# Patient Record
Sex: Male | Born: 1980 | Race: White | Hispanic: No | Marital: Married
Health system: Southern US, Community
[De-identification: ages and names within clinical notes are randomized; demographics above are authoritative.]

---

## 2020-01-03 ENCOUNTER — Other Ambulatory Visit: Payer: Self-pay | Admitting: Cardiovascular Disease

## 2020-01-03 ENCOUNTER — Other Ambulatory Visit (HOSPITAL_COMMUNITY): Payer: Self-pay | Admitting: Cardiovascular Disease

## 2020-01-03 DIAGNOSIS — R072 Precordial pain: Secondary | ICD-10-CM

## 2020-01-06 ENCOUNTER — Telehealth (HOSPITAL_COMMUNITY): Payer: Self-pay | Admitting: Emergency Medicine

## 2020-01-06 NOTE — Telephone Encounter (Signed)
Reaching out to patient to offer assistance regarding upcoming cardiac imaging study; pt verbalizes understanding of appt date/time, parking situation and where to check in, pre-test NPO status and medications ordered, and verified current allergies; name and call back number provided for further questions should they arise Rockwell Alexandria RN Navigator Cardiac Imaging [facility] [phone] office [phone] cell   Pt takes 25mg  metoprolol succinate daily at night. renaldo instructed to take additional the morning of test for HR control.  

## 2020-01-07 ENCOUNTER — Other Ambulatory Visit: Payer: Self-pay

## 2020-01-07 ENCOUNTER — Ambulatory Visit (HOSPITAL_COMMUNITY)
Admission: RE | Admit: 2020-01-07 | Discharge: 2020-01-07 | Disposition: A | Discharge status: Home / Self Care | Payer: BC Managed Care – PPO | Source: Ambulatory Visit | Attending: Cardiovascular Disease | Admitting: Cardiovascular Disease

## 2020-01-07 DIAGNOSIS — R072 Precordial pain: Secondary | ICD-10-CM | POA: Insufficient documentation

## 2020-01-07 IMAGING — CT CT HEART MORP W/ CTA COR W/ SCORE W/ CA W/CM &/OR W/O CM
4 of 7 series · 8 of 20 positions shown, 9 images · IV contrast (APPLIED)
Comparison: None.
COMPARISON: None.

Addendum:
EXAM:
OVER-READ INTERPRETATION  CT CHEST

The following report is an over-read performed by radiologist Dr.
over-read does not include interpretation of cardiac or coronary
anatomy or pathology. The coronary CTA interpretation by the
cardiologist is attached.
CLINICAL DATA: 39-year-old male with chest pain.
Cardiac/Coronary  CTA
TECHNIQUE: The patient was scanned on a [employer] scanner.

[Series 6: best diast 73 % · axial · 0.39mm/px · z∈[+13,+50]mm · 2 of 283 slices shown, 3 images]
[im 95/283  vessel]
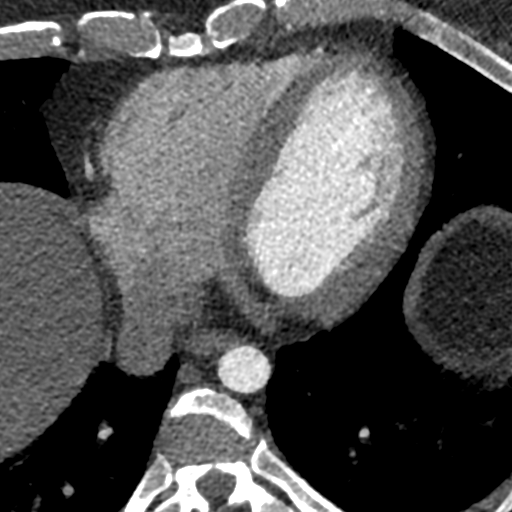
[im 95/283  lung]
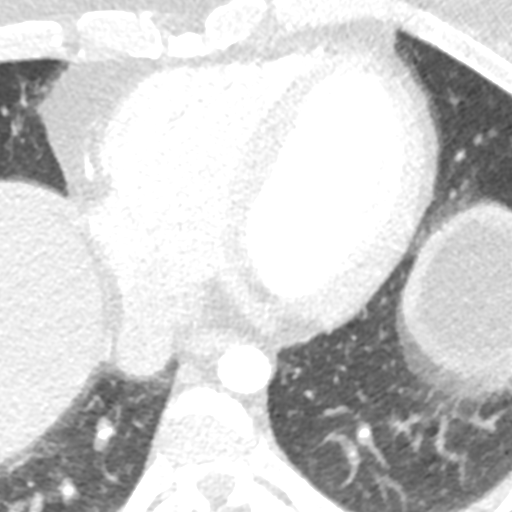
[im 189/283  vessel]
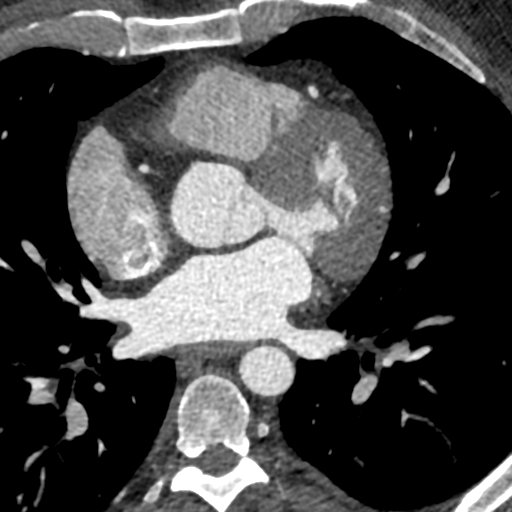

[Series 7: best syst 30 % · axial · 0.39mm/px · z∈[+13,+50]mm · 2 of 283 slices shown]
[im 95/283  vessel]
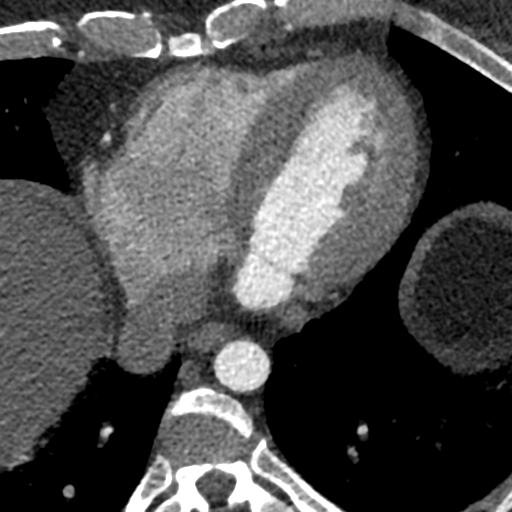
[im 189/283  vessel]
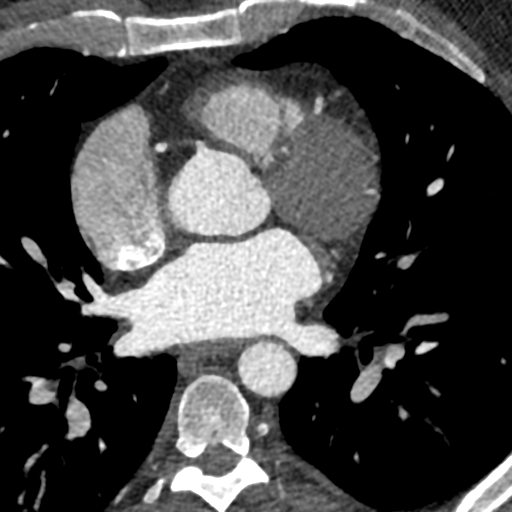

[Series 8: ts diast sharp 73 % · axial · 0.39mm/px · z∈[+13,+50]mm · 2 of 283 slices shown]
[im 95/283  lung]
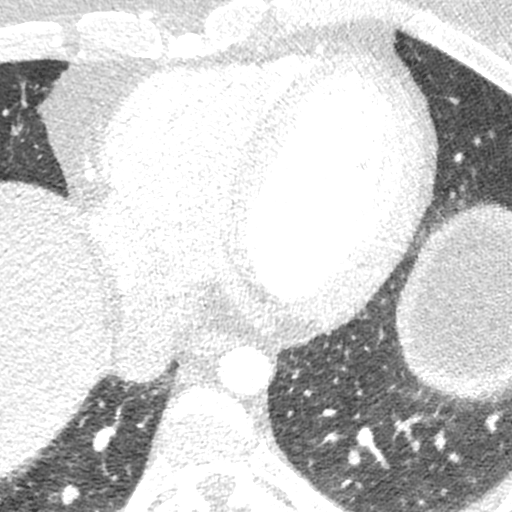
[im 189/283  lung]
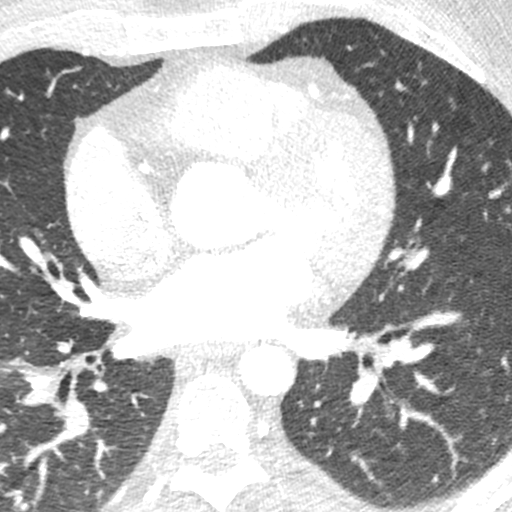

[Series 9: ts syst sharp 30 % · axial · 0.39mm/px · z∈[+13,+50]mm · 2 of 283 slices shown]
[im 95/283  lung]
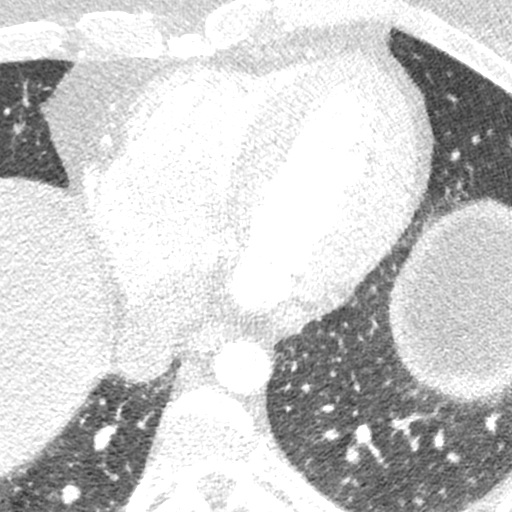
[im 189/283  lung]
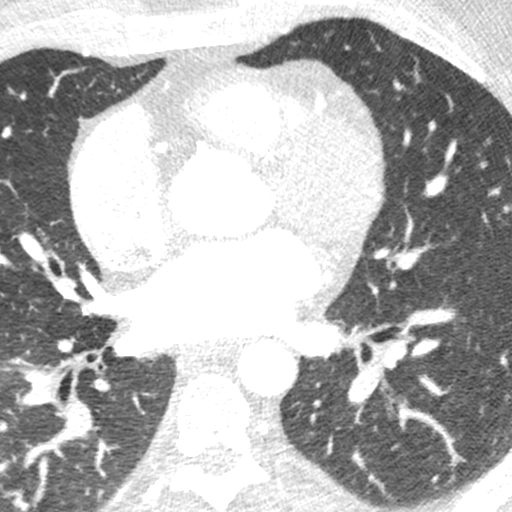

[8 of 20 positions shown; findings below may reference images not displayed]

FINDINGS: Vascular: Normal aortic caliber. No central pulmonary embolism, on
this non-dedicated study.

Mediastinum/Nodes: No imaged thoracic adenopathy.

Lungs/Pleura: No pleural fluid.  Clear imaged lungs.

Upper Abdomen: Normal imaged portions of the liver, spleen, stomach.

Musculoskeletal: No acute osseous abnormality.
IMPRESSION: No acute findings in the imaged extracardiac chest.
FINDINGS: A 100 kV prospective scan was triggered in the descending thoracic
aorta at 111 HU's. Axial non-contrast 3 mm slices were carried out
through the heart. The data set was analyzed on a dedicated work
station and scored using the Agatson method. Gantry rotation speed
was 250 msecs and collimation was .6 mm. 25 mg of PO Metoprolol and
0.8 mg of sl NTG was given. The 3D data set was reconstructed in 5%
intervals of the 67-82 % of the R-R cycle. Diastolic phases were
analyzed on a dedicated work station using MPR, MIP and VRT modes.
The patient received 80 cc of contrast.

Aorta:  Normal size.  No calcifications.  No dissection.

Aortic Valve:  Trileaflet.  No calcifications.

Coronary Arteries:  Normal coronary origin.  Right dominance.

RCA is a large dominant artery that gives rise to PDA and PLA. There
is no plaque.

Left main is a large artery that gives rise to LAD and LCX arteries.

LAD is a large vessel that has no plaque.

LCX is a non-dominant artery that gives rise to one large OM1
branch. There is no plaque.

Other findings:

Normal pulmonary vein drainage into the left atrium.

Normal left atrial appendage without a thrombus.

Normal size of the pulmonary artery.
IMPRESSION: 1. Coronary calcium score of 0. This was 0 percentile for age and
sex matched control.

2. Normal coronary origin with right dominance.

3. CAD-RADS 0. No evidence of CAD (0%). Consider non-atherosclerotic
causes of chest pain.

4. Normal trileaflet aortic valve, normal size of the aortic root
and ascending aorta.

*** End of Addendum ***
EXAM:
OVER-READ INTERPRETATION  CT CHEST

The following report is an over-read performed by radiologist Dr.
over-read does not include interpretation of cardiac or coronary
anatomy or pathology. The coronary CTA interpretation by the
cardiologist is attached.
FINDINGS: Vascular: Normal aortic caliber. No central pulmonary embolism, on
this non-dedicated study.

Mediastinum/Nodes: No imaged thoracic adenopathy.

Lungs/Pleura: No pleural fluid.  Clear imaged lungs.

Upper Abdomen: Normal imaged portions of the liver, spleen, stomach.

Musculoskeletal: No acute osseous abnormality.
IMPRESSION: No acute findings in the imaged extracardiac chest.

## 2020-01-07 MED ORDER — NITROGLYCERIN 0.4 MG SL SUBL
0.8000 mg | SUBLINGUAL_TABLET | Freq: Once | SUBLINGUAL | Status: AC
  Administered 2020-01-07: 0.8 mg via SUBLINGUAL

## 2020-01-07 MED ORDER — IOHEXOL 350 MG/ML SOLN
100.0000 mL | Freq: Once | INTRAVENOUS | Status: AC | PRN
  Administered 2020-01-07: 100 mL via INTRAVENOUS

## 2020-01-07 MED ORDER — NITROGLYCERIN 0.4 MG SL SUBL
SUBLINGUAL_TABLET | SUBLINGUAL | Status: AC
  Filled 2020-01-07: qty 2

## 2020-01-07 NOTE — Progress Notes (Signed)
CT scan completed. Tolerated well. D/c home ambulatory, awake and alert. In no distress
